# Patient Record
Sex: Female | Born: 1935 | Race: White | Hispanic: No
Health system: Southern US, Community
[De-identification: ages and names within clinical notes are randomized; demographics above are authoritative.]

---

## 1999-11-14 ENCOUNTER — Encounter: Admission: RE | Admit: 1999-11-14 | Discharge: 1999-11-14 | Payer: Self-pay | Admitting: *Deleted

## 1999-11-22 ENCOUNTER — Encounter: Payer: Self-pay | Admitting: Orthopedic Surgery

## 1999-11-27 ENCOUNTER — Ambulatory Visit (HOSPITAL_COMMUNITY): Admission: RE | Admit: 1999-11-27 | Discharge: 1999-11-27 | Payer: Self-pay | Admitting: Orthopedic Surgery

## 2000-11-18 ENCOUNTER — Encounter: Admission: RE | Admit: 2000-11-18 | Discharge: 2000-11-18 | Payer: Self-pay | Admitting: *Deleted

## 2001-12-04 ENCOUNTER — Encounter: Payer: Self-pay | Admitting: Nephrology

## 2001-12-04 ENCOUNTER — Encounter: Admission: RE | Admit: 2001-12-04 | Discharge: 2001-12-04 | Payer: Self-pay | Admitting: Nephrology

## 2003-01-12 ENCOUNTER — Encounter (INDEPENDENT_AMBULATORY_CARE_PROVIDER_SITE_OTHER): Payer: Self-pay | Admitting: *Deleted

## 2003-01-14 ENCOUNTER — Encounter: Payer: Self-pay | Admitting: Internal Medicine

## 2003-01-14 ENCOUNTER — Encounter: Admission: RE | Admit: 2003-01-14 | Discharge: 2003-01-14 | Payer: Self-pay | Admitting: Internal Medicine

## 2004-02-24 ENCOUNTER — Ambulatory Visit (HOSPITAL_COMMUNITY): Admission: RE | Admit: 2004-02-24 | Discharge: 2004-02-24 | Payer: Self-pay | Admitting: *Deleted

## 2004-04-06 ENCOUNTER — Emergency Department (HOSPITAL_COMMUNITY): Admission: EM | Admit: 2004-04-06 | Discharge: 2004-04-06 | Payer: Self-pay

## 2004-11-14 ENCOUNTER — Emergency Department (HOSPITAL_COMMUNITY): Admission: EM | Admit: 2004-11-14 | Discharge: 2004-11-15 | Payer: Self-pay | Admitting: Emergency Medicine

## 2005-03-28 ENCOUNTER — Ambulatory Visit (HOSPITAL_COMMUNITY): Admission: RE | Admit: 2005-03-28 | Discharge: 2005-03-28 | Payer: Self-pay | Admitting: Internal Medicine

## 2005-10-27 ENCOUNTER — Emergency Department (HOSPITAL_COMMUNITY): Admission: EM | Admit: 2005-10-27 | Discharge: 2005-10-27 | Payer: Self-pay | Admitting: Emergency Medicine

## 2005-11-05 ENCOUNTER — Ambulatory Visit: Payer: Self-pay | Admitting: Gastroenterology

## 2005-11-08 ENCOUNTER — Ambulatory Visit (HOSPITAL_COMMUNITY): Admission: RE | Admit: 2005-11-08 | Discharge: 2005-11-08 | Payer: Self-pay | Admitting: Gastroenterology

## 2005-11-10 ENCOUNTER — Encounter (INDEPENDENT_AMBULATORY_CARE_PROVIDER_SITE_OTHER): Payer: Self-pay | Admitting: *Deleted

## 2005-11-21 ENCOUNTER — Ambulatory Visit: Payer: Self-pay | Admitting: Gastroenterology

## 2005-11-27 ENCOUNTER — Encounter (INDEPENDENT_AMBULATORY_CARE_PROVIDER_SITE_OTHER): Payer: Self-pay | Admitting: *Deleted

## 2005-11-27 ENCOUNTER — Ambulatory Visit: Payer: Self-pay | Admitting: Gastroenterology

## 2006-05-01 ENCOUNTER — Ambulatory Visit (HOSPITAL_COMMUNITY): Admission: RE | Admit: 2006-05-01 | Discharge: 2006-05-01 | Payer: Self-pay | Admitting: Internal Medicine

## 2007-06-11 ENCOUNTER — Ambulatory Visit (HOSPITAL_COMMUNITY): Admission: RE | Admit: 2007-06-11 | Discharge: 2007-06-11 | Payer: Self-pay | Admitting: Internal Medicine

## 2008-07-28 ENCOUNTER — Ambulatory Visit (HOSPITAL_COMMUNITY): Admission: RE | Admit: 2008-07-28 | Discharge: 2008-07-28 | Payer: Self-pay | Admitting: Internal Medicine

## 2009-08-31 ENCOUNTER — Ambulatory Visit (HOSPITAL_COMMUNITY): Admission: RE | Admit: 2009-08-31 | Discharge: 2009-08-31 | Payer: Self-pay | Admitting: Internal Medicine

## 2009-09-12 ENCOUNTER — Encounter: Admission: RE | Admit: 2009-09-12 | Discharge: 2009-09-12 | Payer: Self-pay | Admitting: Internal Medicine

## 2010-03-15 ENCOUNTER — Telehealth: Payer: Self-pay | Admitting: Internal Medicine

## 2010-03-15 ENCOUNTER — Encounter (INDEPENDENT_AMBULATORY_CARE_PROVIDER_SITE_OTHER): Payer: Self-pay | Admitting: *Deleted

## 2010-03-15 ENCOUNTER — Encounter: Admission: RE | Admit: 2010-03-15 | Discharge: 2010-03-15 | Payer: Self-pay | Admitting: Internal Medicine

## 2010-03-17 DIAGNOSIS — E785 Hyperlipidemia, unspecified: Secondary | ICD-10-CM

## 2010-03-17 DIAGNOSIS — E559 Vitamin D deficiency, unspecified: Secondary | ICD-10-CM | POA: Insufficient documentation

## 2010-03-17 DIAGNOSIS — R609 Edema, unspecified: Secondary | ICD-10-CM | POA: Insufficient documentation

## 2010-03-17 DIAGNOSIS — G609 Hereditary and idiopathic neuropathy, unspecified: Secondary | ICD-10-CM | POA: Insufficient documentation

## 2010-03-17 DIAGNOSIS — R32 Unspecified urinary incontinence: Secondary | ICD-10-CM

## 2010-03-17 DIAGNOSIS — IMO0002 Reserved for concepts with insufficient information to code with codable children: Secondary | ICD-10-CM | POA: Insufficient documentation

## 2010-03-17 DIAGNOSIS — D649 Anemia, unspecified: Secondary | ICD-10-CM

## 2010-03-17 DIAGNOSIS — I10 Essential (primary) hypertension: Secondary | ICD-10-CM | POA: Insufficient documentation

## 2010-03-17 DIAGNOSIS — M949 Disorder of cartilage, unspecified: Secondary | ICD-10-CM

## 2010-03-17 DIAGNOSIS — R634 Abnormal weight loss: Secondary | ICD-10-CM

## 2010-03-17 DIAGNOSIS — M1710 Unilateral primary osteoarthritis, unspecified knee: Secondary | ICD-10-CM

## 2010-03-17 DIAGNOSIS — E538 Deficiency of other specified B group vitamins: Secondary | ICD-10-CM | POA: Insufficient documentation

## 2010-03-17 DIAGNOSIS — M899 Disorder of bone, unspecified: Secondary | ICD-10-CM | POA: Insufficient documentation

## 2010-03-17 DIAGNOSIS — R7301 Impaired fasting glucose: Secondary | ICD-10-CM | POA: Insufficient documentation

## 2010-03-17 DIAGNOSIS — Z8679 Personal history of other diseases of the circulatory system: Secondary | ICD-10-CM | POA: Insufficient documentation

## 2010-03-20 ENCOUNTER — Ambulatory Visit: Payer: Self-pay | Admitting: Internal Medicine

## 2010-03-20 DIAGNOSIS — R142 Eructation: Secondary | ICD-10-CM

## 2010-03-20 DIAGNOSIS — R143 Flatulence: Secondary | ICD-10-CM

## 2010-03-20 DIAGNOSIS — R933 Abnormal findings on diagnostic imaging of other parts of digestive tract: Secondary | ICD-10-CM | POA: Insufficient documentation

## 2010-03-20 DIAGNOSIS — R141 Gas pain: Secondary | ICD-10-CM

## 2010-03-21 ENCOUNTER — Ambulatory Visit: Payer: Self-pay | Admitting: Internal Medicine

## 2010-03-23 ENCOUNTER — Ambulatory Visit: Admission: RE | Admit: 2010-03-23 | Discharge: 2010-03-23 | Payer: Self-pay | Admitting: Gynecologic Oncology

## 2010-03-28 ENCOUNTER — Ambulatory Visit: Payer: Self-pay | Admitting: Critical Care Medicine

## 2010-03-28 ENCOUNTER — Inpatient Hospital Stay (HOSPITAL_COMMUNITY): Admission: RE | Admit: 2010-03-28 | Discharge: 2010-04-11 | Payer: Self-pay | Admitting: Obstetrics & Gynecology

## 2010-03-28 ENCOUNTER — Encounter: Payer: Self-pay | Admitting: Obstetrics & Gynecology

## 2010-03-31 ENCOUNTER — Encounter: Payer: Self-pay | Admitting: Surgery

## 2010-04-04 ENCOUNTER — Ambulatory Visit: Payer: Self-pay | Admitting: Oncology

## 2010-04-11 ENCOUNTER — Ambulatory Visit: Payer: Self-pay | Admitting: Oncology

## 2010-09-24 DEATH — deceased

## 2010-10-15 ENCOUNTER — Encounter: Payer: Self-pay | Admitting: Internal Medicine

## 2010-10-16 ENCOUNTER — Encounter: Payer: Self-pay | Admitting: Internal Medicine

## 2010-10-24 NOTE — Procedures (Signed)
Summary: Flexible Sigmoidoscopy  Patient: Terry Perkins Note: All result statuses are Final unless otherwise noted.  Tests: (1) Flexible Sigmoidoscopy (FLX)  FLX Flexible Sigmoidoscopy                             DONE     [facility]     [address]     [place], [place]  [address]           FLEXIBLE SIGMOIDOSCOPY PROCEDURE REPORT           PATIENT:  Terry Perkins, Terry Perkins  MR#:  [id number]     BIRTHDATE:  06/17/1924, 85 yrs. old  GENDER:  female           ENDOSCOPIST:  Wilhemina Bonito. Eda Keys, MD     Referred by:  Rodrigo Ran, M.D.           PROCEDURE DATE:  03/21/2010     PROCEDURE:  Flexible Sigmoidoscopy, diagnostic     ASA CLASS:  Class II     INDICATIONS:  abnormal imaging w/ ? distal rectal mass     (colonoscopy "07 w/ diverticulosis)           MEDICATIONS:   none           DESCRIPTION OF PROCEDURE:   After the risks benefits and     alternatives of the procedure were thoroughly explained, informed     consent was obtained.  Digital rectal exam was performed and     revealed no abnormalities.   The [id number] endoscope was     introduced through the anus and advanced to the mid transverse     colon, without limitations.  The quality of the prep was     excellent.  The instrument was then slowly withdrawn as the mucosa     was fully examined.     <<PROCEDUREIMAGES>>           Moderate diverticulosis was found in the entire left and     transverse colon  The examination was otherwise normal.     Retroflexed views in the rectum revealed no abnormalities. NO MASS     OR TUMOR IN COLON.   The scope was then withdrawn from the patient     and the procedure terminated.           COMPLICATIONS:  None           ENDOSCOPIC IMPRESSION:     1) Moderate diverticulosis in the left colon     2) Otherwise normal examination.           RECOMMENDATIONS:     1) follow-up with Dr Waynard Edwards           ______________________________     Wilhemina Bonito. Eda Keys, MD           CC:   Rodrigo Ran, MD, The Patient           n.     eSIGNED:   Wilhemina Bonito. Eda Keys at 03/21/2010 09:44 AM           Darcella Cheshire, [id number]  Note: An exclamation mark (!) indicates a result that was not dispersed into the flowsheet. Document Creation Date: 03/21/2010 9:45 AM _______________________________________________________________________  (1) Order result status: Final Collection or observation date-time: 03/21/2010 09:36 Requested date-time:  Receipt date-time:  Reported date-time:  Referring Physician:   Ordering Physician: Fransico Setters [phone]) Specimen  Source:  Source: Launa Grill Order Number: [id number] Lab site:   Appended Document: Flexible Sigmoidoscopy

## 2010-10-24 NOTE — Progress Notes (Signed)
Summary: DR. Waynard Edwards calling for patient to be seen  Phone Note From Other Clinic   Caller: DR. MARK PERINI Licensed conveyancer) Call For: [facility] Details for Reason: NEEDS TO HAVE PATIENT SEEN Summary of Call: 77 year old with weight loss and anemia. Saw Dr. Victorino Dike for colonoscopy 11-2005. CT scan today shows large and smaller pelvic masses. Prior hyst/BSO. ? etiology. ?colon evaluation...  PATTY: please schedule appt for patient to see me Mon. 03-20-10 @ 8:30am. Have someone call her with the appt (phone # [phone]). Also, please block my 1:45pm slot that day (now empty). Finally, get old chart to my deskfor my review, put prior colonoscopy report in EMR, and todays CT in EMR. Thanks! Initial call taken by: Hilarie Fredrickson MD,  March 15, 2010 9:12 PM  Follow-up for Phone Call        pt aware of the appt and the reports added to EMR also the chart has been ordered. Follow-up by: Chales Abrahams CMA Duncan Dull),  March 16, 2010 8:10 AM

## 2010-10-24 NOTE — Procedures (Signed)
Summary: Colon   Colonoscopy  Procedure date:  01/12/2003  Findings:      Location:  [facility].    Colonoscopy  Procedure date:  01/12/2003  Findings:      Location:  [facility].   Patient Name: Terry Perkins, Terry Perkins MRN:  Procedure Procedures: Colonoscopy CPT: [id number].  Personnel: Endoscopist: Ulyess Mort, MD.  Referred By: Rodrigo Ran, MD.  Exam Location: Exam performed in Endoscopy Suite.  Patient Consent: Procedure, Alternatives, Risks and Benefits discussed, consent obtained,  Indications  Average Risk Screening Routine.  History  Pre-Exam Physical: Performed Jan 12, 2003. Cardio-pulmonary exam, Rectal exam, HEENT exam , Abdominal exam, Extremity exam, Mental status exam WNL.  Exam Exam: Extent of exam reached: Cecum, extent intended: Cecum.  The cecum was identified by appendiceal orifice and IC valve. Colon retroflexion performed. Images were not taken. ASA Classification: II. Tolerance: excellent.  Monitoring: Pulse and BP monitoring, Oximetry used. Supplemental O2 given.  Colon Prep Prep results: good.  Fluoroscopy: Fluoroscopy was not used.  Sedation Meds: Demerol 75 mg. given IV. Versed 6 mg. given IV.  Findings - POLYP: Ascending Colon, Maximum size: 2 mm. sessile polyp. Procedure:  hot biopsy, removed, not retrieved,  - DIVERTICULOSIS: Transverse Colon to Sigmoid Colon. ICD9: Diverticulosis: 562.10. Comments: mild --mod .  POLYP: Transverse Colon, Maximum size: 3 mm. sessile polyp. Procedure:  hot biopsy, removed, not retrieved,  POLYP: Transverse Colon, Maximum size: 3 mm. sessile polyp. Procedure:  hot biopsy, removed, not retrieved,  - POLYP: Descending Colon, Maximum size: 3 mm. sessile polyp. Procedure:  hot biopsy, removed, not retrieved,  POLYP: Ascending Colon, Maximum size: 3 mm. sessile polyp. Procedure:  hot biopsy, removed, not retrieved,  POLYP: Descending Colon, Maximum size: 3 mm. sessile  polyp. Procedure:  hot biopsy, removed, not retrieved,  - POLYP: Descending Colon, Maximum size: 7 mm. sessile polyp. Procedure:  hot biopsy, removed, retrieved, Polyp sent to pathology. ICD9: Colon Polyps: 211.3.  POLYP: Sigmoid Colon, Maximum size: 3 mm. sessile polyp. Procedure:  hot biopsy, removed, not retrieved,  POLYP: Sigmoid Colon, Maximum size: 2 mm. sessile polyp. Procedure:  hot biopsy, removed, not retrieved,  POLYP: Sigmoid Colon, Maximum size: 3 mm. sessile polyp. Procedure:  hot biopsy, removed, not retrieved,  POLYP: Sigmoid Colon, Maximum size: 4 mm. sessile polyp. Procedure:  hot biopsy, removed, not retrieved,  POLYP: Sigmoid Colon, Maximum size: 2 mm. sessile polyp. Procedure:  hot biopsy, removed, not retrieved,  POLYP: Sigmoid Colon, Maximum size: 2 mm. sessile polyp. Procedure:  hot biopsy, removed, not retrieved,  POLYP: Sigmoid Colon, Maximum size: 3 mm. sessile polyp. Procedure:  hot biopsy, removed, not retrieved,   Assessment Abnormal examination, see findings above.  Diagnoses: 211.3: Colon Polyps.  562.10: Diverticulosis.   Events  Unplanned Interventions: No intervention was required.  Unplanned Events: There were no complications. Plans Medication Plan: Await pathology. Continue current medications.  Patient Education: Patient given standard instructions for: Polyps. Diverticulosis. Yearly hemoccult testing recommended. Patient instructed to get routine colonoscopy every 2 years.  Disposition: After procedure patient sent to recovery.

## 2010-10-24 NOTE — Assessment & Plan Note (Signed)
Summary: weight loss and anemia/pl   per Dr Marina Goodell   History of Present Illness Visit Type: Initial Consult Primary GI MD: Yancey Flemings MD Primary Provider: Rodrigo Ran, MD Requesting Provider: Rodrigo Ran, MD Chief Complaint: weight loss and amenia x 2-3 months History of Present Illness:   77 year old white female with a history of hypertension, hyperlipidemia, cerebrovascular disease with transient ischemic attacks, and osteoarthritis. She is sent today regarding abdominal fullness, weight loss, and an abnormal CT scan. She is accompanied by one of her daughters. The patient reports noticing unexplained weight loss 2-3 months ago. She was placed on antidepressant therapy but had side effects. The weight loss continued. She also began to notice progressive abdominal distention. She denies any pain, change in bowel habits, or bleeding. Her weight loss is estimated to be 22 pounds over the past 2-3 months. Laboratories from February 27, 2010 reveal anemia with a hemoglobin of 9.9, calcium 11.4, albumin 2.9. TSH was normal. The patient subsequently developed bilateral ankle edema. Thus, on March 15, 2010 she underwent a CT scan of the chest abdomen and pelvis. The chest revealed an enlarged right lower paratracheal lymph node but was otherwise normal. The abdomen and pelvis revealed an extremely large central peritoneal mass arising from the pelvis. In addition, extensive peritoneal metastasis in the upper abdomen and pelvis as well as an eccentric mass within the distal rectum/anal canal. 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 However, primary anal/rectal cancer could not be excluded. Of interest, the patient did undergo complete colonoscopy in March of 2007 with Dr. Corinda Gubler. This revealed diverticulosis and external hemorrhoids. Retroflex view of the rectum was normal. Upper endoscopy at that same time revealed a benign distal stricture which was dilated. She  has not been seen since.   GI Review of Systems    Reports bloating and  weight loss.   Weight loss of 22 pounds over 2-3 months.   Denies abdominal pain, acid reflux, belching, chest pain, dysphagia with liquids, dysphagia with solids, heartburn, loss of appetite, nausea, vomiting, vomiting blood, and  weight gain.        Denies anal fissure, black tarry stools, change in bowel habit, constipation, diarrhea, diverticulosis, fecal incontinence, heme positive stool, hemorrhoids, irritable bowel syndrome, jaundice, light color stool, liver problems, rectal bleeding, and  rectal pain.    Current Medications (verified): 1)  Atenolol 50 Mg Tabs (Atenolol) .... Take 1/2 Tablet By Mouth Once Daily 2)  Aspirin 325 Mg Tabs (Aspirin) .... Take One By Mouth Once Daily 3)  Caltrate 600+d Plus 600-400 Mg-Unit Tabs (Calcium Carbonate-Vit D-Min) .... Take One By Mouth Two Times A Day 4)  Lotensin 10 Mg Tabs (Benazepril Hcl) .... Take One By Mouth Once Daily 5)  Pravastatin Sodium 40 Mg Tabs (Pravastatin Sodium) .... Take One By Mouth At Bedtime 6)  Vitamin B-12 250 Mcg Tabs (Cyanocobalamin) .... Take One By Mouth Once Daily 7)  Vitamin D (Ergocalciferol) 50000 Unit Caps (Ergocalciferol) .... Take One By Mouth Once A Week 8)  Lasix 20 Mg Tabs (Furosemide) .... Take One By Mouth Once Daily 9)  Potassium Chloride Cr 10 Meq Cr-Tabs (Potassium Chloride) .... Take One By Mouth Every Day 10)  Ferrous Sulfate 325 (65 Fe) Mg Tabs (Ferrous Sulfate) .... One Tablet By Mouth Once Daily  Allergies (verified): 1)  ! * Tetanus  Past History:  Past Medical History: Reviewed history from 03/17/2010 and no changes required. WEIGHT LOSS (ICD-783.21) ANEMIA (ICD-285.9) HYPERCALCEMIA (  ICD-275.42) OSTEOARTHRITIS, KNEE (ICD-715.96) IMPAIRED FASTING GLUCOSE (ICD-790.21) HYPERLIPIDEMIA (ICD-272.4) EDEMA (ICD-782.3) PERIPHERAL NEUROPATHY (ICD-356.9) VITAMIN D DEFICIENCY (ICD-268.9) VITAMIN B12 DEFICIENCY  (ICD-266.2) OSTEOPENIA (ICD-733.90) URINARY INCONTINENCE (ICD-788.30) HYPERTENSION (ICD-401.9) TRANSIENT ISCHEMIC ATTACKS, HX OF (ICD-V12.50) Squamous Cell Cancer right leg Basel Cell Cancer on face  Past Surgical History: Hysterectomy  Family History: No FH of Colon Cancer:  Social History: Widowed Patient has never smoked.  Alcohol Use - yes less than one per day Daily Caffeine Use 1 per day Illicit Drug Use - no  Review of Systems       The patient complains of fatigue and swelling of feet/legs.  The patient denies allergy/sinus, anemia, anxiety-new, arthritis/joint pain, back pain, blood in urine, breast changes/lumps, change in vision, confusion, cough, coughing up blood, depression-new, fainting, fever, headaches-new, hearing problems, heart murmur, heart rhythm changes, itching, menstrual pain, muscle pains/cramps, night sweats, nosebleeds, pregnancy symptoms, shortness of breath, skin rash, sleeping problems, sore throat, swollen lymph glands, thirst - excessive , urination - excessive , urination changes/pain, urine leakage, vision changes, and voice change.    Vital Signs:  Patient profile:   77 year old female Height:      62 inches Weight:      135.6 pounds BMI:     24.89 Pulse rate:   70 / minute Pulse rhythm:   regular BP sitting:   126 / 68  (left arm) Cuff size:   regular  Vitals Entered By: Christie Nottingham CMA Duncan Dull) (March 20, 2010 8:28 AM)  Physical Exam  General:  Well developed, well nourished, no acute distress. Head:  Normocephalic and atraumatic. Eyes:  PERRLA, no icterus.. Conjunctiva are pale Nose:  No deformity, discharge,  or lesions. Mouth:  No deformity or lesions Neck:  Supple; no masses or thyromegaly. Lungs:  Clear throughout to auscultation. Heart:  Regular rate and rhythm; no murmurs, rubs,  or bruits. Abdomen:  firm and distended consistent with underlying mass. No tenderness. Bowel sounds Rectal:  deferred Msk:  some degenerative  changes of the joints in the hands and knees Pulses:  Normal pulses noted. Extremities:  2+ edema bilaterally Neurologic:  Alert and  oriented x4;  grossly normal neurologically. Skin:  Intact without significant lesions or rashes. Psych:  Alert and cooperative. Normal mood and affect.   Impression & Recommendations:  Problem # 1:  ABNORMAL FINDINGS GI TRACT (ICD-793.4) CT scan of the abdomen and pelvis revealing large mass with peritoneal implants consistent with primary gynecologic type cancer in an 77 year old with prior hysterectomy. Question raised about possible colorectal etiology. Negative colonoscopy in 2007. My index of suspicion for primary colorectal cancer is low. However, need to proceed with workup and evaluation.  Plan: #1. Flexible sigmoidoscopy with biopsies if necessary. The nature of the procedure as well as the risks, benefits, and alternatives were reviewed with the patient and her daughter. They understood and agreed to proceed. We will set up examination for tomorrow morning at 9 AM  Problem # 2:  ABDOMINAL DISTENSION (ICD-787.3) progressive abdominal distention due to previously mentioned abdominal peritoneal mass.. Dr. Waynard Edwards was working on GYN/oncologic surgeon evaluation  Problem # 3:  WEIGHT LOSS (ICD-783.21) weight loss secondary to underlying malignancy  Other Orders: Flex with Sedation (Flex w/Sed)  Patient Instructions: 1)  Flex sig. scheduled for 03/21/10 9:00 am and arrive at 8:00 am 2)  Instructions given. 3)  Colonoscopy and Flexible Sigmoidoscopy brochure given.  4)  Copy sent to : Rodrigo Ran, MD 5)  The medication list was reviewed  and reconciled.  All changed / newly prescribed medications were explained.  A complete medication list was provided to the patient / caregiver.

## 2010-10-24 NOTE — Procedures (Signed)
Summary: EGD   EGD  Procedure date:  11/27/2005  Findings:      Location: [facility]    EGD  Procedure date:  11/27/2005  Findings:      Location: [facility]   Patient Name: Terry, Perkins. MRN:  Procedure Procedures: Panendoscopy (EGD) CPT: 43235.    with esophageal dilation. CPT: [id number].  Personnel: Endoscopist: Ulyess Mort, MD.  Exam Location: Exam performed in Outpatient Clinic. Outpatient  Patient Consent: Procedure, Alternatives, Risks and Benefits discussed, consent obtained, from patient. Consent was obtained by the RN.  Indications Symptoms: Dysphagia.  History  Current Medications: Patient is not currently taking Coumadin.  Pre-Exam Physical: Performed Jan 12, 2003  Entire physical exam was normal. Cardio- pulmonary exam, HEENT exam, Abdominal exam, Extremity exam, Mental status exam WNL.  Comments: Pt. history reviewed/updated, physical exam performed prior to initiation of sedation? Exam Exam Info: Maximum depth of insertion Duodenum, intended Duodenum. Patient position: on left side. Vocal cords visualized. Gastric retroflexion performed. Images taken. ASA Classification: III. Tolerance: excellent.  Sedation Meds: Patient assessed and found to be appropriate for moderate (conscious) sedation. Fentanyl 50 mcg. given IV. Versed 5 mg. given IV. Cetacaine Spray 2 sprays given aerosolized.  Monitoring: BP and pulse monitoring done. Oximetry used. Supplemental O2 given  Findings - HIATAL HERNIA: 2 cms. in length. stricture and mild presbyesophagous. ICD9: Esophageal Stricture: 530.3. - Dilation: Fundus. Savary dilator used, Diameter: 16,17 mm, Minimal Resistance, No Heme present on extraction. Patient tolerance excellent.  - Normal: Fundus to Jejunum.   Assessment Abnormal examination, see findings above.  Diagnoses: 530.3: Esophageal Stricture.   Events  Unplanned Intervention: No unplanned  interventions were required.  Unplanned Events: There were no complications. Plans Medication(s): Continue current medications. PPI: QAM,   Patient Education: Patient given standard instructions for: Hiatal Hernia. Reflux. Stenosis / Stricture.  Disposition: After procedure patient sent to recovery. After recovery patient sent home.

## 2010-10-24 NOTE — Letter (Signed)
Summary: [facility]  [address] [place], [place] [address]   Phone: [phone]  Fax: [phone]       Terry Perkins    1924-03-22    MRN: [id number]        Procedure Day /Date:TUESDAY, 03/21/10     Arrival Time:8:00 AM     Procedure Time:9:00 AM     Location of Procedure:                    [facility] (4th Floor)    PREPARATION FOR FLEXIBLE SIGMOIDOSCOPY WITH MAGNESIUM CITRATE  Prior to the day before your procedure, purchase one 8 oz. bottle of Magnesium Citrate and one Fleet Enema from the laxative section of your drugstore.  _________________________________________________________________________________________________  THE DAY BEFORE YOUR PROCEDURE             DATE: 03/20/10   DAY: MONDAY (TODAY)  1.   Have a clear liquid dinner the night before your procedure.  2.   Do not drink anything colored red or purple.  Avoid juices with pulp.  No orange juice.              CLEAR LIQUIDS INCLUDE: Water Jello Ice Popsicles Tea (sugar ok, no milk/cream) Powdered fruit flavored drinks Coffee (sugar ok, no milk/cream) Gatorade Juice: apple, white grape, white cranberry  Lemonade Clear bullion, consomm, broth Carbonated beverages (any kind) Strained chicken noodle soup Hard Candy   3.   At 7:00 pm the night before your procedure, drink one bottle of Magnesium Citrate over ice.  4.   Drink at least 3 more glasses of clear liquids before bedtime (preferably juices).  5.   Results are expected usually within 1 to 6 hours after taking the Magnesium Citrate.  ___________________________________________________________________________________________________  THE DAY OF YOUR PROCEDURE            DATE: 03/21/10 DAY: TUESDAY  1.   Use Fleet Enema one hour prior to coming for procedure.  2.   You may drink clear liquids until 7:00 AM(2 hours before exam)       MEDICATION INSTRUCTIONS  Unless otherwise  instructed, you should take regular prescription medications with a small sip of water as early as possible the morning of your procedure.           OTHER INSTRUCTIONS  You will need a responsible adult at least 75 years of age to accompany you and drive you home.   This person must remain in the waiting room during your procedure.  Wear loose fitting clothing that is easily removed.  Leave jewelry and other valuables at home.  However, you may wish to bring a book to read or an iPod/MP3 player to listen to music as you wait for your procedure to start.  Remove all body piercing jewelry and leave at home.  Total time from sign-in until discharge is approximately 2-3 hours.  You should go home directly after your procedure and rest.  You can resume normal activities the day after your procedure.  The day of your procedure you should not:   Drive   Make legal decisions   Operate machinery   Drink alcohol   Return to work  You will receive specific instructions about eating, activities and medications before you leave.   The above instructions have been reviewed and explained to me by   _______________________    I fully understand and can verbalize these instructions _____________________________ Date  _________ 

## 2010-10-24 NOTE — Procedures (Signed)
Summary: Colon   Colonoscopy  Procedure date:  11/27/2005  Findings:      Location:  [facility].    Colonoscopy  Procedure date:  11/27/2005  Findings:      Location:  [facility].   Patient Name: Terry, Perkins MRN:  Procedure Procedures: Colonoscopy CPT: [phone].  Personnel: Endoscopist: Ulyess Mort, MD.  Exam Location: Exam performed in Outpatient Clinic. Outpatient  Patient Consent: Procedure, Alternatives, Risks and Benefits discussed, consent obtained, from patient. Consent was obtained by the RN.  Indications  Surveillance of: Adenomatous Polyp(s).  History  Current Medications: Patient is not currently taking Coumadin.  Pre-Exam Physical: Performed Jan 12, 2003. Entire physical exam was normal. Cardio- pulmonary exam, Rectal exam, HEENT exam , Abdominal exam, Extremity exam, Mental status exam WNL.  Comments: Pt. history reviewed/updated, physical exam performed prior to initiation of sedation? Exam Exam: Extent of exam reached: Cecum, extent intended: Cecum.  The cecum was identified by appendiceal orifice and IC valve. Colon retroflexion performed. Images taken. ASA Classification: III. Tolerance: excellent.  Monitoring: Pulse and BP monitoring, Oximetry used. Supplemental O2 given.  Sedation Meds: Patient assessed and found to be appropriate for moderate (conscious) sedation.  Findings - DIVERTICULOSIS: Descending Colon to Sigmoid Colon. ICD9: Diverticulosis, Colon: 562.10. Comments: mild-mod.  - NOT SEEN ON EXAM: Cecum to Rectum. Polyps, AVM's, Colitis, Tumors, Melanosis,  - HEMORRHOIDS: External. Size: Grade I. ICD9: Hemorrhoids, External: 455.3.   Assessment Abnormal examination, see findings above.  Diagnoses: 562.10: Diverticulosis, Colon.  455.3: Hemorrhoids, External.   Events  Unplanned Interventions: No intervention was required.  Unplanned Events: There were no  complications. Plans Medication Plan: Continue current medications.  Patient Education: Patient given standard instructions for: Diverticulosis. Hemorrhoids. Yearly hemoccult testing recommended. prn.  Disposition: After procedure patient sent to recovery. After recovery patient sent home.

## 2010-12-09 LAB — GLUCOSE, CAPILLARY
Glucose-Capillary: 100 mg/dL — ABNORMAL HIGH (ref 70–99)
Glucose-Capillary: 114 mg/dL — ABNORMAL HIGH (ref 70–99)
Glucose-Capillary: 114 mg/dL — ABNORMAL HIGH (ref 70–99)

## 2010-12-09 LAB — CBC
HCT: 23.3 % — ABNORMAL LOW (ref 36.0–46.0)
HCT: 24.9 % — ABNORMAL LOW (ref 36.0–46.0)
Hemoglobin: 7.2 g/dL — ABNORMAL LOW (ref 12.0–15.0)
MCH: 29.6 pg (ref 26.0–34.0)
MCHC: 33.3 g/dL (ref 30.0–36.0)
MCHC: 33.5 g/dL (ref 30.0–36.0)
MCHC: 34 g/dL (ref 30.0–36.0)
MCV: 87.2 fL (ref 78.0–100.0)
MCV: 88.3 fL (ref 78.0–100.0)
Platelets: 265 10*3/uL (ref 150–400)
Platelets: 379 10*3/uL (ref 150–400)
Platelets: 427 10*3/uL — ABNORMAL HIGH (ref 150–400)
Platelets: 533 10*3/uL — ABNORMAL HIGH (ref 150–400)
Platelets: 560 10*3/uL — ABNORMAL HIGH (ref 150–400)
RBC: 2.68 MIL/uL — ABNORMAL LOW (ref 3.87–5.11)
RBC: 3.13 MIL/uL — ABNORMAL LOW (ref 3.87–5.11)
RDW: 16.1 % — ABNORMAL HIGH (ref 11.5–15.5)
RDW: 16.1 % — ABNORMAL HIGH (ref 11.5–15.5)
RDW: 16.1 % — ABNORMAL HIGH (ref 11.5–15.5)
RDW: 16.2 % — ABNORMAL HIGH (ref 11.5–15.5)
RDW: 16.2 % — ABNORMAL HIGH (ref 11.5–15.5)
WBC: 10.3 10*3/uL (ref 4.0–10.5)
WBC: 12.6 10*3/uL — ABNORMAL HIGH (ref 4.0–10.5)

## 2010-12-09 LAB — DIFFERENTIAL
Basophils Absolute: 0 10*3/uL (ref 0.0–0.1)
Basophils Absolute: 0 10*3/uL (ref 0.0–0.1)
Basophils Absolute: 0 10*3/uL (ref 0.0–0.1)
Basophils Absolute: 0 10*3/uL (ref 0.0–0.1)
Basophils Absolute: 0 10*3/uL (ref 0.0–0.1)
Basophils Relative: 0 % (ref 0–1)
Basophils Relative: 0 % (ref 0–1)
Lymphocytes Relative: 10 % — ABNORMAL LOW (ref 12–46)
Lymphocytes Relative: 11 % — ABNORMAL LOW (ref 12–46)
Lymphocytes Relative: 8 % — ABNORMAL LOW (ref 12–46)
Lymphs Abs: 1 10*3/uL (ref 0.7–4.0)
Lymphs Abs: 1 10*3/uL (ref 0.7–4.0)
Monocytes Absolute: 0.5 10*3/uL (ref 0.1–1.0)
Monocytes Absolute: 1.2 10*3/uL — ABNORMAL HIGH (ref 0.1–1.0)
Monocytes Absolute: 1.4 10*3/uL — ABNORMAL HIGH (ref 0.1–1.0)
Monocytes Relative: 10 % (ref 3–12)
Monocytes Relative: 8 % (ref 3–12)
Neutro Abs: 10.2 10*3/uL — ABNORMAL HIGH (ref 1.7–7.7)
Neutro Abs: 11.5 10*3/uL — ABNORMAL HIGH (ref 1.7–7.7)
Neutro Abs: 13.1 10*3/uL — ABNORMAL HIGH (ref 1.7–7.7)
Neutro Abs: 6.9 10*3/uL (ref 1.7–7.7)
Neutro Abs: 8.5 10*3/uL — ABNORMAL HIGH (ref 1.7–7.7)
Neutrophils Relative %: 66 % (ref 43–77)
Neutrophils Relative %: 81 % — ABNORMAL HIGH (ref 43–77)
Neutrophils Relative %: 81 % — ABNORMAL HIGH (ref 43–77)

## 2010-12-09 LAB — COMPREHENSIVE METABOLIC PANEL
Albumin: 1.5 g/dL — ABNORMAL LOW (ref 3.5–5.2)
Albumin: 1.6 g/dL — ABNORMAL LOW (ref 3.5–5.2)
Alkaline Phosphatase: 206 U/L — ABNORMAL HIGH (ref 39–117)
BUN: 68 mg/dL — ABNORMAL HIGH (ref 6–23)
BUN: 91 mg/dL — ABNORMAL HIGH (ref 6–23)
Creatinine, Ser: 5.15 mg/dL — ABNORMAL HIGH (ref 0.4–1.2)
Creatinine, Ser: 5.94 mg/dL — ABNORMAL HIGH (ref 0.4–1.2)
Glucose, Bld: 83 mg/dL (ref 70–99)
Potassium: 3.9 mEq/L (ref 3.5–5.1)
Total Bilirubin: 0.8 mg/dL (ref 0.3–1.2)
Total Protein: 4.1 g/dL — ABNORMAL LOW (ref 6.0–8.3)
Total Protein: 4.7 g/dL — ABNORMAL LOW (ref 6.0–8.3)

## 2010-12-09 LAB — CULTURE, BLOOD (ROUTINE X 2)
Culture: NO GROWTH
Culture: NO GROWTH

## 2010-12-09 LAB — BASIC METABOLIC PANEL
BUN: 79 mg/dL — ABNORMAL HIGH (ref 6–23)
CO2: 18 mEq/L — ABNORMAL LOW (ref 19–32)
Calcium: 8.2 mg/dL — ABNORMAL LOW (ref 8.4–10.5)
Calcium: 8.2 mg/dL — ABNORMAL LOW (ref 8.4–10.5)
Calcium: 8.8 mg/dL (ref 8.4–10.5)
Creatinine, Ser: 4.63 mg/dL — ABNORMAL HIGH (ref 0.4–1.2)
Creatinine, Ser: 5.82 mg/dL — ABNORMAL HIGH (ref 0.4–1.2)
GFR calc Af Amer: 9 mL/min — ABNORMAL LOW (ref >60)
GFR calc non Af Amer: 7 mL/min — ABNORMAL LOW (ref >60)
GFR calc non Af Amer: 7 mL/min — ABNORMAL LOW (ref >60)
GFR calc non Af Amer: 9 mL/min — ABNORMAL LOW (ref >60)
Glucose, Bld: 130 mg/dL — ABNORMAL HIGH (ref 70–99)
Glucose, Bld: 90 mg/dL (ref 70–99)
Sodium: 133 mEq/L — ABNORMAL LOW (ref 135–145)
Sodium: 138 mEq/L (ref 135–145)

## 2010-12-09 LAB — URINALYSIS, ROUTINE W REFLEX MICROSCOPIC
Nitrite: NEGATIVE
Specific Gravity, Urine: 1.011 (ref 1.005–1.030)
Urobilinogen, UA: 0.2 mg/dL (ref 0.0–1.0)

## 2010-12-09 LAB — PHOSPHORUS
Phosphorus: 5.9 mg/dL — ABNORMAL HIGH (ref 2.3–4.6)
Phosphorus: 6.9 mg/dL — ABNORMAL HIGH (ref 2.3–4.6)

## 2010-12-09 LAB — URINE MICROSCOPIC-ADD ON

## 2010-12-09 LAB — HEPATIC FUNCTION PANEL
AST: 20 U/L (ref 0–37)
Bilirubin, Direct: 0.4 mg/dL — ABNORMAL HIGH (ref 0.0–0.3)
Total Protein: 4.6 g/dL — ABNORMAL LOW (ref 6.0–8.3)

## 2010-12-09 LAB — CHOLESTEROL, TOTAL: Cholesterol: 103 mg/dL (ref 0–200)

## 2010-12-10 LAB — COMPREHENSIVE METABOLIC PANEL
ALT: 1226 U/L — ABNORMAL HIGH (ref 0–35)
ALT: 165 U/L — ABNORMAL HIGH (ref 0–35)
ALT: 261 U/L — ABNORMAL HIGH (ref 0–35)
ALT: 81 U/L — ABNORMAL HIGH (ref 0–35)
ALT: 1020 U/L — ABNORMAL HIGH (ref 0–35)
ALT: 844 U/L — ABNORMAL HIGH (ref 0–35)
AST: 181 U/L — ABNORMAL HIGH (ref 0–37)
AST: 1896 U/L — ABNORMAL HIGH (ref 0–37)
AST: 34 U/L (ref 0–37)
AST: 40 U/L — ABNORMAL HIGH (ref 0–37)
AST: 2052 U/L — ABNORMAL HIGH (ref 0–37)
AST: 3257 U/L — ABNORMAL HIGH (ref 0–37)
Albumin: 1.5 g/dL — ABNORMAL LOW (ref 3.5–5.2)
Albumin: 1.5 g/dL — ABNORMAL LOW (ref 3.5–5.2)
Albumin: 1.6 g/dL — ABNORMAL LOW (ref 3.5–5.2)
Albumin: 1.6 g/dL — ABNORMAL LOW (ref 3.5–5.2)
Albumin: 1.7 g/dL — ABNORMAL LOW (ref 3.5–5.2)
Alkaline Phosphatase: 102 U/L (ref 39–117)
BUN: 12 mg/dL (ref 6–23)
BUN: 13 mg/dL (ref 6–23)
BUN: 35 mg/dL — ABNORMAL HIGH (ref 6–23)
BUN: 17 mg/dL (ref 6–23)
CO2: 23 mEq/L (ref 19–32)
CO2: 24 mEq/L (ref 19–32)
CO2: 27 mEq/L (ref 19–32)
CO2: 29 mEq/L (ref 19–32)
CO2: 20 mEq/L (ref 19–32)
Calcium: 7.2 mg/dL — ABNORMAL LOW (ref 8.4–10.5)
Calcium: 7.9 mg/dL — ABNORMAL LOW (ref 8.4–10.5)
Calcium: 8.5 mg/dL (ref 8.4–10.5)
Calcium: 8.7 mg/dL (ref 8.4–10.5)
Calcium: 8.9 mg/dL (ref 8.4–10.5)
Calcium: 7.3 mg/dL — ABNORMAL LOW (ref 8.4–10.5)
Calcium: 8.2 mg/dL — ABNORMAL LOW (ref 8.4–10.5)
Chloride: 100 mEq/L (ref 96–112)
Chloride: 103 mEq/L (ref 96–112)
Chloride: 93 mEq/L — ABNORMAL LOW (ref 96–112)
Chloride: 95 mEq/L — ABNORMAL LOW (ref 96–112)
Chloride: 108 mEq/L (ref 96–112)
Chloride: 112 mEq/L (ref 96–112)
Creatinine, Ser: 1.16 mg/dL (ref 0.4–1.2)
Creatinine, Ser: 1.49 mg/dL — ABNORMAL HIGH (ref 0.4–1.2)
Creatinine, Ser: 4.27 mg/dL — ABNORMAL HIGH (ref 0.4–1.2)
Creatinine, Ser: 3.07 mg/dL — ABNORMAL HIGH (ref 0.4–1.2)
GFR calc Af Amer: 10 mL/min — ABNORMAL LOW (ref >60)
GFR calc Af Amer: 12 mL/min — ABNORMAL LOW (ref >60)
GFR calc Af Amer: 16 mL/min — ABNORMAL LOW (ref >60)
GFR calc Af Amer: 22 mL/min — ABNORMAL LOW (ref >60)
GFR calc Af Amer: 54 mL/min — ABNORMAL LOW (ref >60)
GFR calc Af Amer: 18 mL/min — ABNORMAL LOW (ref >60)
GFR calc Af Amer: 41 mL/min — ABNORMAL LOW (ref >60)
GFR calc non Af Amer: 21 mL/min — ABNORMAL LOW (ref >60)
GFR calc non Af Amer: 45 mL/min — ABNORMAL LOW (ref >60)
GFR calc non Af Amer: 8 mL/min — ABNORMAL LOW (ref >60)
GFR calc non Af Amer: 34 mL/min — ABNORMAL LOW (ref >60)
Glucose, Bld: 137 mg/dL — ABNORMAL HIGH (ref 70–99)
Glucose, Bld: 140 mg/dL — ABNORMAL HIGH (ref 70–99)
Glucose, Bld: 146 mg/dL — ABNORMAL HIGH (ref 70–99)
Glucose, Bld: 84 mg/dL (ref 70–99)
Glucose, Bld: 141 mg/dL — ABNORMAL HIGH (ref 70–99)
Glucose, Bld: 232 mg/dL — ABNORMAL HIGH (ref 70–99)
Potassium: 4 mEq/L (ref 3.5–5.1)
Potassium: 5.2 mEq/L — ABNORMAL HIGH (ref 3.5–5.1)
Potassium: 4.3 mEq/L (ref 3.5–5.1)
Potassium: 5 mEq/L (ref 3.5–5.1)
Sodium: 129 mEq/L — ABNORMAL LOW (ref 135–145)
Sodium: 130 mEq/L — ABNORMAL LOW (ref 135–145)
Sodium: 134 mEq/L — ABNORMAL LOW (ref 135–145)
Sodium: 135 mEq/L (ref 135–145)
Sodium: 131 mEq/L — ABNORMAL LOW (ref 135–145)
Sodium: 135 mEq/L (ref 135–145)
Sodium: 140 mEq/L (ref 135–145)
Total Bilirubin: 0.9 mg/dL (ref 0.3–1.2)
Total Bilirubin: 2 mg/dL — ABNORMAL HIGH (ref 0.3–1.2)
Total Bilirubin: 0.9 mg/dL (ref 0.3–1.2)
Total Bilirubin: 2.1 mg/dL — ABNORMAL HIGH (ref 0.3–1.2)
Total Protein: 3.4 g/dL — ABNORMAL LOW (ref 6.0–8.3)
Total Protein: 3.5 g/dL — ABNORMAL LOW (ref 6.0–8.3)
Total Protein: 3.7 g/dL — ABNORMAL LOW (ref 6.0–8.3)
Total Protein: 3.3 g/dL — ABNORMAL LOW (ref 6.0–8.3)

## 2010-12-10 LAB — GLUCOSE, CAPILLARY
Glucose-Capillary: 102 mg/dL — ABNORMAL HIGH (ref 70–99)
Glucose-Capillary: 113 mg/dL — ABNORMAL HIGH (ref 70–99)
Glucose-Capillary: 120 mg/dL — ABNORMAL HIGH (ref 70–99)
Glucose-Capillary: 122 mg/dL — ABNORMAL HIGH (ref 70–99)
Glucose-Capillary: 129 mg/dL — ABNORMAL HIGH (ref 70–99)
Glucose-Capillary: 140 mg/dL — ABNORMAL HIGH (ref 70–99)
Glucose-Capillary: 142 mg/dL — ABNORMAL HIGH (ref 70–99)
Glucose-Capillary: 143 mg/dL — ABNORMAL HIGH (ref 70–99)
Glucose-Capillary: 157 mg/dL — ABNORMAL HIGH (ref 70–99)
Glucose-Capillary: 158 mg/dL — ABNORMAL HIGH (ref 70–99)
Glucose-Capillary: 158 mg/dL — ABNORMAL HIGH (ref 70–99)
Glucose-Capillary: 161 mg/dL — ABNORMAL HIGH (ref 70–99)
Glucose-Capillary: 164 mg/dL — ABNORMAL HIGH (ref 70–99)
Glucose-Capillary: 175 mg/dL — ABNORMAL HIGH (ref 70–99)
Glucose-Capillary: 75 mg/dL (ref 70–99)
Glucose-Capillary: 78 mg/dL (ref 70–99)
Glucose-Capillary: 84 mg/dL (ref 70–99)
Glucose-Capillary: 86 mg/dL (ref 70–99)
Glucose-Capillary: 97 mg/dL (ref 70–99)
Glucose-Capillary: 103 mg/dL — ABNORMAL HIGH (ref 70–99)
Glucose-Capillary: 115 mg/dL — ABNORMAL HIGH (ref 70–99)
Glucose-Capillary: 132 mg/dL — ABNORMAL HIGH (ref 70–99)

## 2010-12-10 LAB — RENAL FUNCTION PANEL
BUN: 14 mg/dL (ref 6–23)
CO2: 23 mEq/L (ref 19–32)
CO2: 27 mEq/L (ref 19–32)
Calcium: 7.5 mg/dL — ABNORMAL LOW (ref 8.4–10.5)
GFR calc Af Amer: 57 mL/min — ABNORMAL LOW (ref >60)
GFR calc non Af Amer: 47 mL/min — ABNORMAL LOW (ref >60)
Glucose, Bld: 201 mg/dL — ABNORMAL HIGH (ref 70–99)
Glucose, Bld: 89 mg/dL (ref 70–99)
Phosphorus: 2.1 mg/dL — ABNORMAL LOW (ref 2.3–4.6)
Phosphorus: 3.1 mg/dL (ref 2.3–4.6)
Potassium: 4.3 mEq/L (ref 3.5–5.1)
Sodium: 133 mEq/L — ABNORMAL LOW (ref 135–145)
Sodium: 136 mEq/L (ref 135–145)

## 2010-12-10 LAB — CANCER ANTIGEN 19-9: CA 19-9: 8 U/mL — ABNORMAL LOW (ref <35.0)

## 2010-12-10 LAB — CBC
HCT: 24 % — ABNORMAL LOW (ref 36.0–46.0)
HCT: 27.2 % — ABNORMAL LOW (ref 36.0–46.0)
HCT: 28.8 % — ABNORMAL LOW (ref 36.0–46.0)
HCT: 28.8 % — ABNORMAL LOW (ref 36.0–46.0)
HCT: 31.7 % — ABNORMAL LOW (ref 36.0–46.0)
HCT: 33 % — ABNORMAL LOW (ref 36.0–46.0)
HCT: 34.4 % — ABNORMAL LOW (ref 36.0–46.0)
Hemoglobin: 10.8 g/dL — ABNORMAL LOW (ref 12.0–15.0)
Hemoglobin: 8.1 g/dL — ABNORMAL LOW (ref 12.0–15.0)
Hemoglobin: 8.5 g/dL — ABNORMAL LOW (ref 12.0–15.0)
Hemoglobin: 8.8 g/dL — ABNORMAL LOW (ref 12.0–15.0)
Hemoglobin: 11.4 g/dL — ABNORMAL LOW (ref 12.0–15.0)
Hemoglobin: 11.8 g/dL — ABNORMAL LOW (ref 12.0–15.0)
MCH: 29.5 pg (ref 26.0–34.0)
MCH: 29.6 pg (ref 26.0–34.0)
MCH: 29.7 pg (ref 26.0–34.0)
MCH: 29.7 pg (ref 26.0–34.0)
MCH: 29.9 pg (ref 26.0–34.0)
MCH: 30 pg (ref 26.0–34.0)
MCH: 30.3 pg (ref 26.0–34.0)
MCH: 30.4 pg (ref 26.0–34.0)
MCHC: 33.5 g/dL (ref 30.0–36.0)
MCHC: 33.5 g/dL (ref 30.0–36.0)
MCHC: 33.6 g/dL (ref 30.0–36.0)
MCHC: 33.7 g/dL (ref 30.0–36.0)
MCHC: 33.9 g/dL (ref 30.0–36.0)
MCHC: 34.1 g/dL (ref 30.0–36.0)
MCHC: 34.2 g/dL (ref 30.0–36.0)
MCHC: 34.4 g/dL (ref 30.0–36.0)
MCV: 87.8 fL (ref 78.0–100.0)
MCV: 88.1 fL (ref 78.0–100.0)
MCV: 88.2 fL (ref 78.0–100.0)
MCV: 88.5 fL (ref 78.0–100.0)
Platelets: 122 10*3/uL — ABNORMAL LOW (ref 150–400)
Platelets: 47 10*3/uL — ABNORMAL LOW (ref 150–400)
Platelets: 51 10*3/uL — ABNORMAL LOW (ref 150–400)
Platelets: 86 10*3/uL — ABNORMAL LOW (ref 150–400)
Platelets: 73 10*3/uL — ABNORMAL LOW (ref 150–400)
Platelets: 93 10*3/uL — ABNORMAL LOW (ref 150–400)
RBC: 2.81 MIL/uL — ABNORMAL LOW (ref 3.87–5.11)
RBC: 2.89 MIL/uL — ABNORMAL LOW (ref 3.87–5.11)
RBC: 3.07 MIL/uL — ABNORMAL LOW (ref 3.87–5.11)
RBC: 3.28 MIL/uL — ABNORMAL LOW (ref 3.87–5.11)
RBC: 3.64 MIL/uL — ABNORMAL LOW (ref 3.87–5.11)
RBC: 2.91 MIL/uL — ABNORMAL LOW (ref 3.87–5.11)
RBC: 3.73 MIL/uL — ABNORMAL LOW (ref 3.87–5.11)
RBC: 3.88 MIL/uL (ref 3.87–5.11)
RDW: 16.2 % — ABNORMAL HIGH (ref 11.5–15.5)
RDW: 16.3 % — ABNORMAL HIGH (ref 11.5–15.5)
RDW: 16.5 % — ABNORMAL HIGH (ref 11.5–15.5)
RDW: 16.5 % — ABNORMAL HIGH (ref 11.5–15.5)
RDW: 15.6 % — ABNORMAL HIGH (ref 11.5–15.5)
WBC: 13.9 10*3/uL — ABNORMAL HIGH (ref 4.0–10.5)
WBC: 17.3 10*3/uL — ABNORMAL HIGH (ref 4.0–10.5)
WBC: 19.3 10*3/uL — ABNORMAL HIGH (ref 4.0–10.5)
WBC: 23.9 10*3/uL — ABNORMAL HIGH (ref 4.0–10.5)
WBC: 15.8 10*3/uL — ABNORMAL HIGH (ref 4.0–10.5)
WBC: 19.9 10*3/uL — ABNORMAL HIGH (ref 4.0–10.5)
WBC: 23.6 10*3/uL — ABNORMAL HIGH (ref 4.0–10.5)

## 2010-12-10 LAB — POCT I-STAT EG7
Acid-base deficit: 11 mmol/L — ABNORMAL HIGH (ref 0.0–2.0)
Acid-base deficit: 26 mmol/L — ABNORMAL HIGH (ref 0.0–2.0)
Acid-base deficit: 29 mmol/L — ABNORMAL HIGH (ref 0.0–2.0)
Bicarbonate: 12.6 mEq/L — ABNORMAL LOW (ref 20.0–24.0)
Calcium, Ion: 1.52 mmol/L — ABNORMAL HIGH (ref 1.12–1.32)
Calcium, Ion: <0.25 mmol/L — CL (ref 1.12–1.32)
HCT: 51 % — ABNORMAL HIGH (ref 36.0–46.0)
O2 Saturation: 20 %
O2 Saturation: 21 %
O2 Saturation: 69 %
Potassium: >9 mEq/L (ref 3.5–5.1)
TCO2: 20 mmol/L — ABNORMAL LOW (ref 23.0–27.0)
pO2, Ven: 32 mmHg (ref 30.0–45.0)
pO2, Ven: 39 mmHg (ref 30.0–45.0)

## 2010-12-10 LAB — BLOOD GAS, ARTERIAL
Acid-base deficit: 6.6 mmol/L — ABNORMAL HIGH (ref 0.0–2.0)
Acid-base deficit: 9.5 mmol/L — ABNORMAL HIGH (ref 0.0–2.0)
Acid-base deficit: 9.9 mmol/L — ABNORMAL HIGH (ref 0.0–2.0)
Bicarbonate: 14.7 mEq/L — ABNORMAL LOW (ref 20.0–24.0)
Bicarbonate: 17.5 mEq/L — ABNORMAL LOW (ref 20.0–24.0)
O2 Saturation: 94.3 %
O2 Saturation: 95.8 %
O2 Saturation: 97.6 %
Patient temperature: 98.6
TCO2: 14.2 mmol/L (ref 0–100)
TCO2: 16.4 mmol/L (ref 0–100)
pCO2 arterial: 31.8 mmHg — ABNORMAL LOW (ref 35.0–45.0)
pCO2 arterial: 44.6 mmHg (ref 35.0–45.0)
pO2, Arterial: 107 mmHg — ABNORMAL HIGH (ref 80.0–100.0)
pO2, Arterial: 66.7 mmHg — ABNORMAL LOW (ref 80.0–100.0)

## 2010-12-10 LAB — PROTIME-INR
INR: 1.12 (ref 0.00–1.49)
INR: 1.52 — ABNORMAL HIGH (ref 0.00–1.49)
INR: 1.66 — ABNORMAL HIGH (ref 0.00–1.49)
INR: 1.79 — ABNORMAL HIGH (ref 0.00–1.49)
Prothrombin Time: 14.3 seconds (ref 11.6–15.2)
Prothrombin Time: 17.9 seconds — ABNORMAL HIGH (ref 11.6–15.2)
Prothrombin Time: 18.2 seconds — ABNORMAL HIGH (ref 11.6–15.2)
Prothrombin Time: 19.5 seconds — ABNORMAL HIGH (ref 11.6–15.2)
Prothrombin Time: 20.6 seconds — ABNORMAL HIGH (ref 11.6–15.2)

## 2010-12-10 LAB — DIFFERENTIAL
Basophils Absolute: 0 10*3/uL (ref 0.0–0.1)
Basophils Absolute: 0 10*3/uL (ref 0.0–0.1)
Basophils Relative: 0 % (ref 0–1)
Basophils Relative: 0 % (ref 0–1)
Eosinophils Absolute: 0.8 10*3/uL — ABNORMAL HIGH (ref 0.0–0.7)
Eosinophils Absolute: 0 10*3/uL (ref 0.0–0.7)
Eosinophils Relative: 0 % (ref 0–5)
Eosinophils Relative: 3 % (ref 0–5)
Lymphocytes Relative: 4 % — ABNORMAL LOW (ref 12–46)
Lymphs Abs: 1.2 10*3/uL (ref 0.7–4.0)
Lymphs Abs: 0.7 10*3/uL (ref 0.7–4.0)
Monocytes Absolute: 0.1 10*3/uL (ref 0.1–1.0)
Monocytes Relative: 6 % (ref 3–12)
Neutro Abs: 11.2 10*3/uL — ABNORMAL HIGH (ref 1.7–7.7)
Neutrophils Relative %: 79 % — ABNORMAL HIGH (ref 43–77)
Neutrophils Relative %: 90 % — ABNORMAL HIGH (ref 43–77)

## 2010-12-10 LAB — BASIC METABOLIC PANEL
BUN: 27 mg/dL — ABNORMAL HIGH (ref 6–23)
CO2: 17 mEq/L — ABNORMAL LOW (ref 19–32)
Calcium: 7.2 mg/dL — ABNORMAL LOW (ref 8.4–10.5)
GFR calc non Af Amer: 12 mL/min — ABNORMAL LOW (ref >60)
Glucose, Bld: 112 mg/dL — ABNORMAL HIGH (ref 70–99)

## 2010-12-10 LAB — CARBOXYHEMOGLOBIN
Methemoglobin: 3.1 % — ABNORMAL HIGH (ref 0.0–1.5)
O2 Saturation: 61.8 %
Total hemoglobin: 10.5 g/dL — ABNORMAL LOW (ref 12.5–16.0)
Total hemoglobin: 10.8 g/dL — ABNORMAL LOW (ref 12.5–16.0)

## 2010-12-10 LAB — POCT I-STAT 4, (NA,K, GLUC, HGB,HCT)
HCT: 19 % — ABNORMAL LOW (ref 36.0–46.0)
Sodium: 138 mEq/L (ref 135–145)

## 2010-12-10 LAB — POCT I-STAT 7, (LYTES, BLD GAS, ICA,H+H)
Acid-base deficit: 10 mmol/L — ABNORMAL HIGH (ref 0.0–2.0)
Acid-base deficit: 9 mmol/L — ABNORMAL HIGH (ref 0.0–2.0)
Bicarbonate: 19.1 mEq/L — ABNORMAL LOW (ref 20.0–24.0)
HCT: 21 % — ABNORMAL LOW (ref 36.0–46.0)
HCT: 24 % — ABNORMAL LOW (ref 36.0–46.0)
Hemoglobin: 7.1 g/dL — ABNORMAL LOW (ref 12.0–15.0)
Hemoglobin: 8.2 g/dL — ABNORMAL LOW (ref 12.0–15.0)
Patient temperature: 33.4
Potassium: 3.7 mEq/L (ref 3.5–5.1)
Sodium: 140 mEq/L (ref 135–145)
pCO2 arterial: 49.9 mmHg — ABNORMAL HIGH (ref 35.0–45.0)
pH, Arterial: 7.17 — CL (ref 7.350–7.400)
pH, Arterial: 7.188 — CL (ref 7.350–7.400)
pO2, Arterial: 278 mmHg — ABNORMAL HIGH (ref 80.0–100.0)

## 2010-12-10 LAB — CROSSMATCH
ABO/RH(D): O NEG
Antibody Screen: NEGATIVE

## 2010-12-10 LAB — PHOSPHORUS
Phosphorus: 2.1 mg/dL — ABNORMAL LOW (ref 2.3–4.6)
Phosphorus: 2.7 mg/dL (ref 2.3–4.6)
Phosphorus: 3.1 mg/dL (ref 2.3–4.6)

## 2010-12-10 LAB — PREPARE FRESH FROZEN PLASMA

## 2010-12-10 LAB — LACTIC ACID, PLASMA
Lactic Acid, Venous: 1.4 mmol/L (ref 0.5–2.2)
Lactic Acid, Venous: 2.2 mmol/L (ref 0.5–2.2)
Lactic Acid, Venous: 4.7 mmol/L — ABNORMAL HIGH (ref 0.5–2.2)

## 2010-12-10 LAB — URIC ACID
Uric Acid, Serum: 4.5 mg/dL (ref 2.4–7.0)
Uric Acid, Serum: 5.6 mg/dL (ref 2.4–7.0)

## 2010-12-10 LAB — APTT
aPTT: 35 seconds (ref 24–37)
aPTT: 37 seconds (ref 24–37)
aPTT: 39 seconds — ABNORMAL HIGH (ref 24–37)

## 2010-12-10 LAB — IRON AND TIBC
Iron: 21 ug/dL — ABNORMAL LOW (ref 42–135)
TIBC: 110 ug/dL — ABNORMAL LOW (ref 250–470)
UIBC: 89 ug/dL

## 2010-12-10 LAB — MAGNESIUM
Magnesium: 2.4 mg/dL (ref 1.5–2.5)
Magnesium: 2.4 mg/dL (ref 1.5–2.5)
Magnesium: 2.5 mg/dL (ref 1.5–2.5)

## 2010-12-10 LAB — PREALBUMIN: Prealbumin: 8.3 mg/dL — ABNORMAL LOW (ref 18.0–45.0)

## 2010-12-10 LAB — TRIGLYCERIDES
Triglycerides: 81 mg/dL (ref <150)
Triglycerides: 133 mg/dL (ref <150)

## 2010-12-10 LAB — CULTURE, BLOOD (ROUTINE X 2): Culture: NO GROWTH

## 2010-12-10 LAB — FOLATE: Folate: 5 ng/mL

## 2010-12-10 LAB — CORTISOL: Cortisol, Plasma: 10.8 ug/dL

## 2010-12-10 LAB — ANTI MULLERIAN HORMONE: AMH AssessR: 1.13 ng/mL

## 2010-12-10 LAB — MRSA PCR SCREENING: MRSA by PCR: NEGATIVE

## 2010-12-10 LAB — FERRITIN: Ferritin: 864 ng/mL — ABNORMAL HIGH (ref 10–291)

## 2010-12-10 LAB — MISCELLANEOUS TEST

## 2010-12-10 LAB — ABO/RH: ABO/RH(D): O NEG

## 2010-12-10 LAB — INHIBIN B: Inhibin B: <10 pg/mL

## 2010-12-10 LAB — AMYLASE: Amylase: 25 U/L (ref 0–105)

## 2010-12-10 LAB — CHOLESTEROL, TOTAL: Cholesterol: 60 mg/dL (ref 0–200)

## 2010-12-11 LAB — TYPE AND SCREEN
ABO/RH(D): O NEG
Antibody Screen: NEGATIVE

## 2010-12-11 LAB — CBC
HCT: 29.5 % — ABNORMAL LOW (ref 36.0–46.0)
Hemoglobin: 9.6 g/dL — ABNORMAL LOW (ref 12.0–15.0)
WBC: 6.8 10*3/uL (ref 4.0–10.5)

## 2010-12-11 LAB — COMPREHENSIVE METABOLIC PANEL
AST: 17 U/L (ref 0–37)
Albumin: 3.2 g/dL — ABNORMAL LOW (ref 3.5–5.2)
BUN: 16 mg/dL (ref 6–23)
Calcium: 10.4 mg/dL (ref 8.4–10.5)
Creatinine, Ser: 0.78 mg/dL (ref 0.4–1.2)
GFR calc Af Amer: >60 mL/min (ref >60)
Total Bilirubin: 0.5 mg/dL (ref 0.3–1.2)
Total Protein: 6.8 g/dL (ref 6.0–8.3)

## 2010-12-11 LAB — DIFFERENTIAL
Basophils Absolute: 0 10*3/uL (ref 0.0–0.1)
Eosinophils Relative: 1 % (ref 0–5)
Lymphocytes Relative: 25 % (ref 12–46)
Lymphs Abs: 1.7 10*3/uL (ref 0.7–4.0)
Monocytes Absolute: 0.7 10*3/uL (ref 0.1–1.0)
Neutro Abs: 4.3 10*3/uL (ref 1.7–7.7)

## 2010-12-11 LAB — SURGICAL PCR SCREEN
MRSA, PCR: NEGATIVE
Staphylococcus aureus: NEGATIVE

## 2010-12-11 LAB — CA 125: CA 125: 32.2 U/mL — ABNORMAL HIGH (ref 0.0–30.2)

## 2011-07-08 IMAGING — CR DG UGI W/ GASTROGRAFIN
1 series · 1 of 1 positions shown · IV contrast (agent unspecified)
Comparison: [HOSPITAL] at [REDACTED] [HOSPITAL] abdominal pelvic
CT 03/15/2010 and [HOSPITAL] portable chest x-ray
03/31/2010 [DATE] hours.

CLINICAL DATA: Evaluate duodenal closure.

WATER SOLUBLE UPPER GI SERIES
TECHNIQUE: Single-column upper GI series was performed using water
soluble contrast.
Fluoroscopy Time: 5.1 minutes
Contrast: 110 ml [id number] via a nasogastric tube.

[view not recorded]
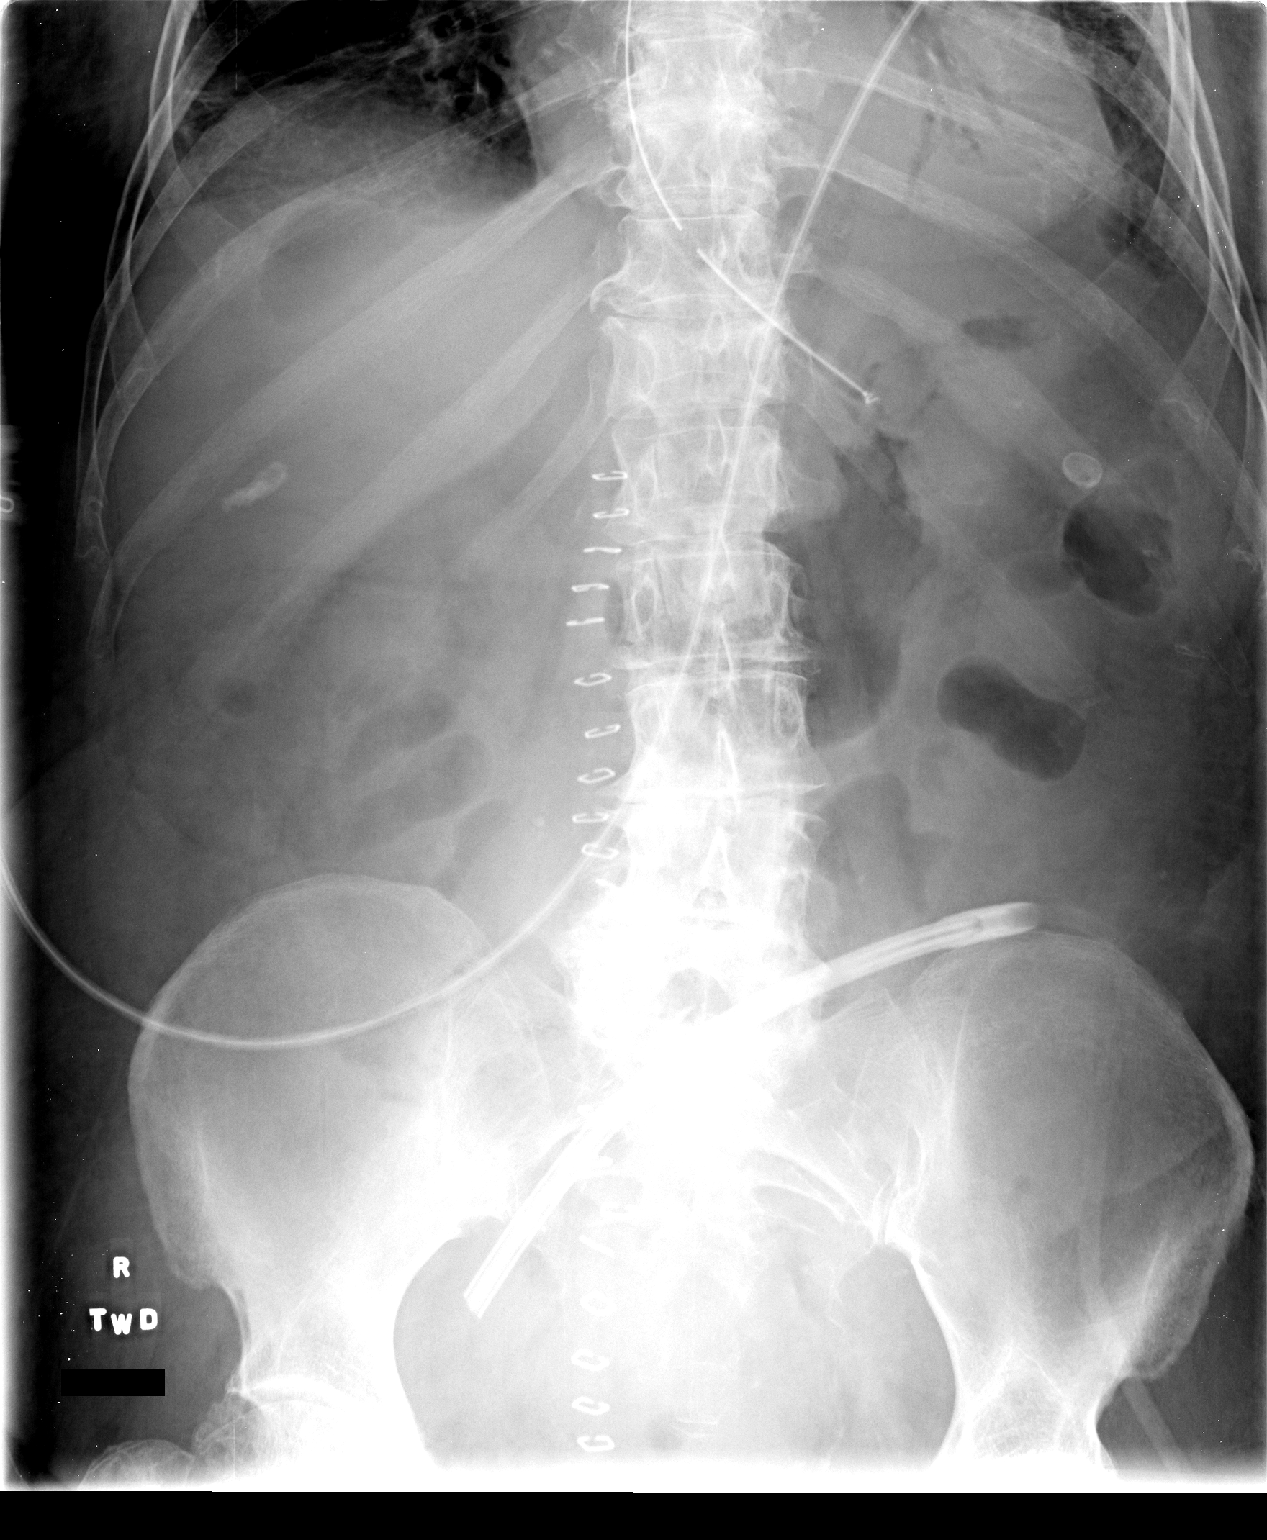

[1 of 1 positions shown; findings below may reference images not displayed]

FINDINGS: Initial injection of water-soluble contrast via a
nasogastric tube primarily fills mid to distal esophagus.
Nasogastric tube subsequently advanced into the gastric body with
further injection of contrast within the stomach.  No evidence for
free intraperitoneal air seen.  Inferior dual lumen of abdominal
drain and right paramedian skin surgical clips seen.  At the level
of the tip of drainage catheter at the transverse/fourth portion
duodenum is amorphous linear probable extravasated contained leak
of contrast.  More proximal large and smaller third to fourth
portion duodenal diverticula visualized.  Tertiary contractions of
visualized esophagus with esophagus, stomach, and duodenum
otherwise unremarkable without obstruction.  Degenerative changes
spine and extensive atheromatous vascular calcification abdominal
aorta of normal caliber seen.  Bowel gas pattern appears normal.
Left lower lobe atelectatic infiltrative change seen.
IMPRESSION: 1.  Findings consistent with small contained leak at
transverse/fourth portion duodenum and (without pneumoperitoneum
and normal gastrointestinal gas pattern.)
2.  Slight advancement of nasogastric tube ending at gastric body
level.
3.  Left lower lobe air bronchograms with atelectatic/infiltrative
change, extensive atheromatous vascular calcifications and
degenerative changes lumbar spine.

## 2011-07-09 IMAGING — CR DG CHEST 1V PORT
1 series · 1 of 1 positions shown · non-contrast
Comparison: 03/31/2010.

CLINICAL DATA: Pelvic mass.  Edema.

PORTABLE CHEST - 1 VIEW

[view not recorded]
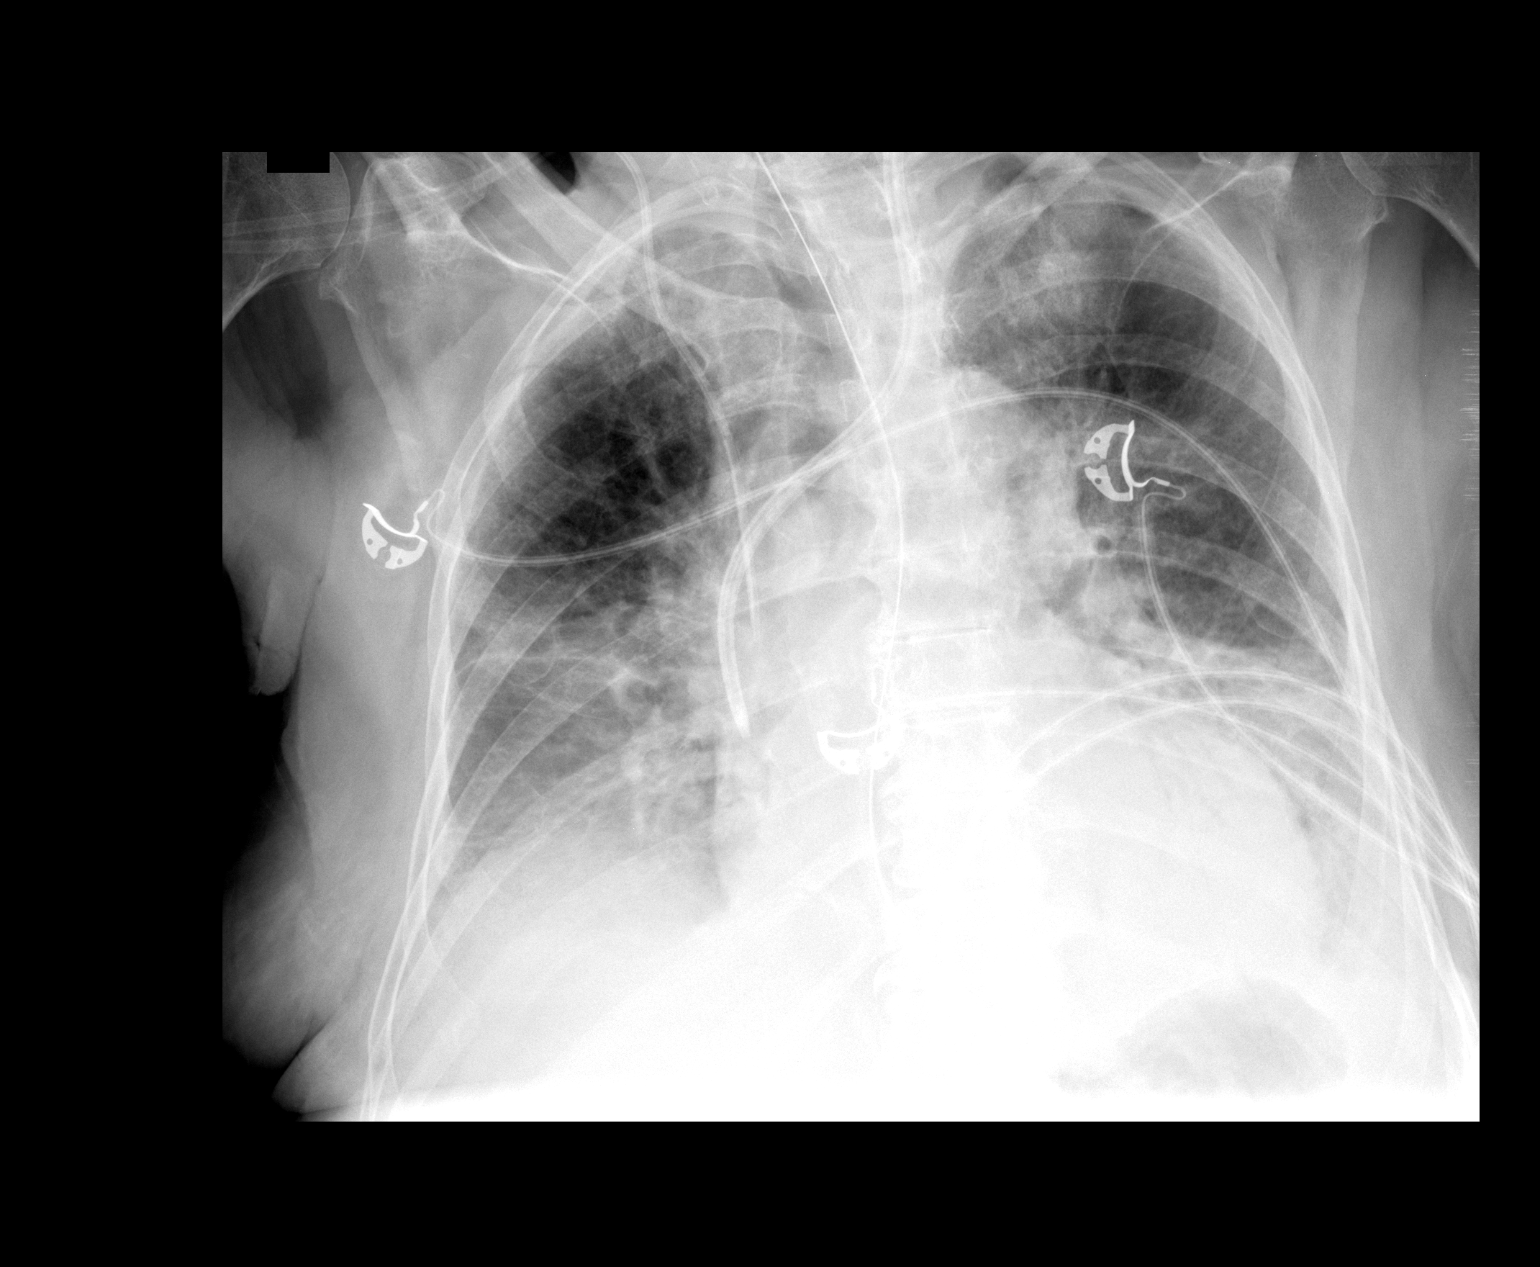

[1 of 1 positions shown; findings below may reference images not displayed]

FINDINGS: Left IJ dialysis catheters present with the tip in the
lower SVC.  Right IJ central line and nasogastric tube are also
noted, unchanged.  Positional shift in pleural effusions.
Bilateral basilar atelectasis and airspace disease is unchanged.
Air bronchograms noted in the left lower lobe.
IMPRESSION: 1.  Stable support apparatus.
2.  Unchanged aeration.  Atelectasis, effusions and basilar
airspace disease

## 2011-07-10 IMAGING — CR DG CHEST 1V PORT
1 series · 1 of 1 positions shown · non-contrast
Comparison: 04/01/2010

CLINICAL DATA: Dialysis patient, pulmonary edema

PORTABLE CHEST - 1 VIEW

[AP]
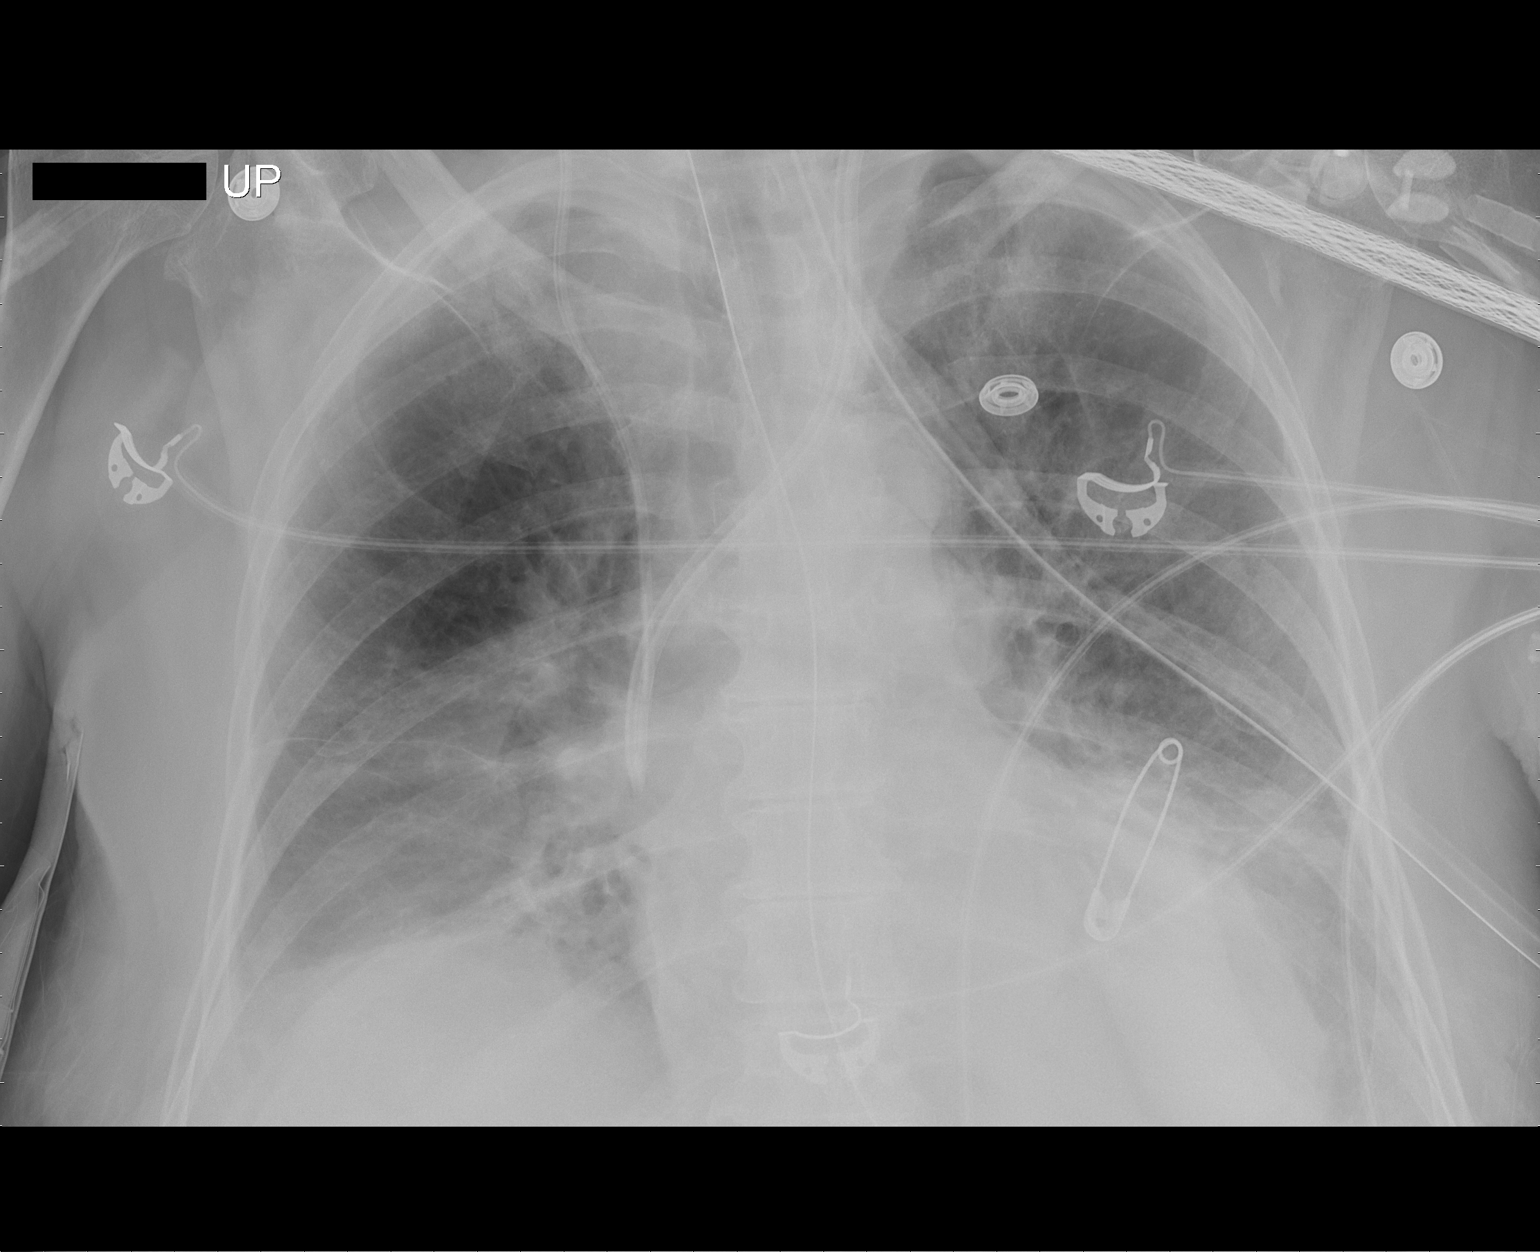

[1 of 1 positions shown; findings below may reference images not displayed]

FINDINGS: Stable support apparatus.  Persistent basilar airspace
disease and atelectasis, worse in the left lower lobe with
posterior effusions.  No developing pneumothorax.  Overall no
significant change.
IMPRESSION: Stable portable chest exam.

## 2014-05-07 ENCOUNTER — Encounter: Payer: Self-pay | Admitting: Internal Medicine
